# Patient Record
Sex: Female | Born: 1965 | Race: Black or African American | Hispanic: No | Marital: Married | Smoking: Never smoker
Health system: Southern US, Community
[De-identification: ages and names within clinical notes are randomized; demographics above are authoritative.]

---

## 2003-05-15 ENCOUNTER — Other Ambulatory Visit: Admission: RE | Admit: 2003-05-15 | Discharge: 2003-05-15 | Payer: Self-pay | Admitting: Obstetrics and Gynecology

## 2004-09-23 ENCOUNTER — Other Ambulatory Visit: Admission: RE | Admit: 2004-09-23 | Discharge: 2004-09-23 | Payer: Self-pay | Admitting: Obstetrics and Gynecology

## 2004-10-21 ENCOUNTER — Encounter: Admission: RE | Admit: 2004-10-21 | Discharge: 2004-10-21 | Payer: Self-pay | Admitting: Obstetrics and Gynecology

## 2008-11-08 ENCOUNTER — Encounter: Admission: RE | Admit: 2008-11-08 | Discharge: 2008-11-08 | Payer: Self-pay | Admitting: Obstetrics and Gynecology

## 2009-06-06 ENCOUNTER — Ambulatory Visit: Payer: Self-pay | Admitting: Vascular Surgery

## 2009-11-11 ENCOUNTER — Ambulatory Visit: Payer: Self-pay | Admitting: Vascular Surgery

## 2010-03-24 ENCOUNTER — Encounter: Admission: RE | Admit: 2010-03-24 | Discharge: 2010-03-24 | Payer: Self-pay | Admitting: Obstetrics and Gynecology

## 2011-02-16 DIAGNOSIS — M79609 Pain in unspecified limb: Secondary | ICD-10-CM

## 2011-04-08 ENCOUNTER — Other Ambulatory Visit: Payer: Self-pay | Admitting: Obstetrics and Gynecology

## 2011-04-08 DIAGNOSIS — Z1231 Encounter for screening mammogram for malignant neoplasm of breast: Secondary | ICD-10-CM

## 2011-04-17 ENCOUNTER — Ambulatory Visit
Admission: RE | Admit: 2011-04-17 | Discharge: 2011-04-17 | Disposition: A | Discharge status: Home / Self Care | Payer: 59 | Source: Ambulatory Visit | Attending: Obstetrics and Gynecology | Admitting: Obstetrics and Gynecology

## 2011-04-17 DIAGNOSIS — Z1231 Encounter for screening mammogram for malignant neoplasm of breast: Secondary | ICD-10-CM

## 2011-05-10 ENCOUNTER — Inpatient Hospital Stay (INDEPENDENT_AMBULATORY_CARE_PROVIDER_SITE_OTHER)
Admission: RE | Admit: 2011-05-10 | Discharge: 2011-05-10 | Disposition: A | Discharge status: Home / Self Care | Payer: 59 | Source: Ambulatory Visit | Attending: Emergency Medicine | Admitting: Emergency Medicine

## 2011-05-10 ENCOUNTER — Ambulatory Visit (INDEPENDENT_AMBULATORY_CARE_PROVIDER_SITE_OTHER): Payer: 59

## 2011-05-10 DIAGNOSIS — R071 Chest pain on breathing: Secondary | ICD-10-CM

## 2011-07-05 ENCOUNTER — Other Ambulatory Visit: Payer: Self-pay

## 2011-07-05 ENCOUNTER — Emergency Department (HOSPITAL_COMMUNITY)
Admission: EM | Admit: 2011-07-05 | Discharge: 2011-07-05 | Disposition: A | Discharge status: Nursing Home (Includes ICF) | Payer: 59 | Source: Home / Self Care | Attending: Emergency Medicine | Admitting: Emergency Medicine

## 2011-07-05 ENCOUNTER — Emergency Department (HOSPITAL_COMMUNITY): Payer: 59

## 2011-07-05 ENCOUNTER — Emergency Department (HOSPITAL_COMMUNITY)
Admission: EM | Admit: 2011-07-05 | Discharge: 2011-07-05 | Disposition: A | Discharge status: Home / Self Care | Payer: 59 | Attending: Emergency Medicine | Admitting: Emergency Medicine

## 2011-07-05 ENCOUNTER — Encounter (HOSPITAL_COMMUNITY): Payer: Self-pay | Admitting: Emergency Medicine

## 2011-07-05 ENCOUNTER — Emergency Department (INDEPENDENT_AMBULATORY_CARE_PROVIDER_SITE_OTHER): Payer: 59

## 2011-07-05 ENCOUNTER — Encounter (HOSPITAL_COMMUNITY): Payer: Self-pay | Admitting: *Deleted

## 2011-07-05 DIAGNOSIS — M546 Pain in thoracic spine: Secondary | ICD-10-CM | POA: Insufficient documentation

## 2011-07-05 DIAGNOSIS — R079 Chest pain, unspecified: Secondary | ICD-10-CM | POA: Insufficient documentation

## 2011-07-05 DIAGNOSIS — IMO0001 Reserved for inherently not codable concepts without codable children: Secondary | ICD-10-CM | POA: Insufficient documentation

## 2011-07-05 DIAGNOSIS — I2699 Other pulmonary embolism without acute cor pulmonale: Secondary | ICD-10-CM

## 2011-07-05 DIAGNOSIS — M7918 Myalgia, other site: Secondary | ICD-10-CM

## 2011-07-05 LAB — POCT I-STAT, CHEM 8
Calcium, Ion: 1.08 mmol/L — ABNORMAL LOW (ref 1.12–1.32)
Creatinine, Ser: 0.5 mg/dL (ref 0.50–1.10)
Glucose, Bld: 81 mg/dL (ref 70–99)
HCT: 29 % — ABNORMAL LOW (ref 36.0–46.0)
Hemoglobin: 9.9 g/dL — ABNORMAL LOW (ref 12.0–15.0)
TCO2: 22 mmol/L (ref 0–100)

## 2011-07-05 LAB — POCT I-STAT TROPONIN I

## 2011-07-05 MED ORDER — KETOROLAC TROMETHAMINE 60 MG/2ML IM SOLN
INTRAMUSCULAR | Status: AC
  Filled 2011-07-05: qty 2

## 2011-07-05 MED ORDER — IBUPROFEN 800 MG PO TABS: 800.0000 mg | ORAL_TABLET | Freq: Three times a day (TID) | ORAL | Status: AC

## 2011-07-05 MED ORDER — TRAMADOL HCL 50 MG PO TABS: 50.0000 mg | ORAL_TABLET | Freq: Four times a day (QID) | ORAL | Status: AC | PRN

## 2011-07-05 MED ORDER — KETOROLAC TROMETHAMINE 60 MG/2ML IM SOLN
60.0000 mg | Freq: Once | INTRAMUSCULAR | Status: AC
  Administered 2011-07-05: 60 mg via INTRAMUSCULAR

## 2011-07-05 MED ORDER — HYDROMORPHONE HCL PF 1 MG/ML IJ SOLN
1.0000 mg | Freq: Once | INTRAMUSCULAR | Status: AC
  Administered 2011-07-05: 1 mg via INTRAVENOUS
  Filled 2011-07-05: qty 1

## 2011-07-05 MED ORDER — ONDANSETRON HCL 4 MG/2ML IJ SOLN
4.0000 mg | Freq: Once | INTRAMUSCULAR | Status: AC
  Administered 2011-07-05: 4 mg via INTRAVENOUS
  Filled 2011-07-05: qty 2

## 2011-07-05 NOTE — ED Provider Notes (Signed)
Patient in CDU holding for CT angio chest.  Patient states pain has resolved.  I have discussed the results with her, have discussed pain control as well as intentional deep breaths and coughing throughout the day.  Pt verbalizes understanding.  States she does not want vicodin for pain as it makes her itch.    Rise Patience, [place] 07/05/11 [phone]

## 2011-07-05 NOTE — ED Notes (Signed)
Received pt from urgent care for further eval of right sided chest pain. Pt reports pain to right upper back worse than pain in right anterior chest area. Pt denies Shortness of breath, swelling or N/V.

## 2011-07-05 NOTE — ED Provider Notes (Signed)
History     CSN: [id number] Arrival date & time: 07/05/2011  2:16 PM   First MD Initiated Contact with Patient 07/05/11 1347      Chief Complaint  Patient presents with  . Back Pain    (Consider location/radiation/quality/duration/timing/severity/associated sxs/prior treatment) HPI Comments: She has a two-week history of intermittent pain in the right upper back radiating to the right lower back and around to the right submammary area. She denies any injury. It's been worse the past couple days it has been continuous and severe. The pain is worse with movement, deep inspiration, sneezing, or yawning and it radiates into her shoulder. She denies any fever, chills, coughing, wheezing, shortness of breath, or hemoptysis. She was here about 2 months ago with the same thing on the left side. She had x-rays and an EKG which were normal and was given ibuprofen and hydrocodone. The pain got better in about a week and she denies any leg pain or swelling.  Patient is a 45 y.o. female presenting with back pain.  Back Pain  Associated symptoms include chest pain. Pertinent negatives include no fever and no abdominal pain.    History reviewed. No pertinent past medical history.  History reviewed. No pertinent past surgical history.  History reviewed. No pertinent family history.  History  Substance Use Topics  . Smoking status: Not on file  . Smokeless tobacco: Not on file  . Alcohol Use: Not on file    OB History    Grav Para Term Preterm Abortions TAB SAB Ect Mult Living                  Review of Systems  Constitutional: Negative for fever, chills and diaphoresis.  Respiratory: Negative for cough, shortness of breath and wheezing.   Cardiovascular: Positive for chest pain. Negative for palpitations and leg swelling.  Gastrointestinal: Negative for nausea, vomiting and abdominal pain.  Musculoskeletal: Positive for back pain.    Allergies  Hydrocodone  Home Medications  No  current outpatient prescriptions on file.  BP 115/78  Pulse 67  Temp(Src) 98.8 F (37.1 C) (Oral)  Resp 19  SpO2 100%  LMP 06/24/2011  Physical Exam  Nursing note and vitals reviewed. Constitutional: She appears well-developed and well-nourished. She appears distressed.       She appears to be moderately uncomfortable.  Cardiovascular: Normal rate, regular rhythm, S1 normal, S2 normal, intact distal pulses and normal pulses.   No extrasystoles are present. PMI is not displaced.  Exam reveals no gallop, no S3, no S4, no distant heart sounds and no friction rub.   No murmur heard. Pulmonary/Chest: Effort normal and breath sounds normal. No respiratory distress. She has no wheezes. She has no rales. She exhibits no tenderness.       There was pain to palpation in the right upper back area.  Abdominal: Soft. Bowel sounds are normal. She exhibits no distension and no mass. There is no tenderness. There is no rebound and no guarding.  Skin: Skin is warm and dry. No rash noted. She is not diaphoretic.    ED Course  Procedures (including critical care time)  Labs Reviewed  D-DIMER, QUANTITATIVE - Abnormal; Notable for the following:    D-Dimer, Quant 0.68 (*)    All other components within normal limits   Dg Chest 2 View  07/05/2011  *RADIOLOGY REPORT*  Clinical Data: Right chest/upper back pain  CHEST - 2 VIEW  Comparison: 05/09/2010  Findings: Lungs are clear. No pleural effusion  or pneumothorax.  The heart is normal in size.  Visualized osseous structures are within normal limits.  IMPRESSION: No evidence of acute cardiopulmonary disease.  Original Report Authenticated By: Charline Bills, M.D.   Results for orders placed during the hospital encounter of 07/05/11  D-DIMER, QUANTITATIVE      Component Value Range   D-Dimer, Quant 0.68 (*) 0.00 - 0.48 (ug/mL-FEU)    Date: 07/05/2011  Rate: 69  Rhythm: normal sinus rhythm  QRS Axis: normal  Intervals: normal  ST/T Wave  abnormalities: normal  Conduction Disutrbances:none  Narrative Interpretation: Normal EKG  Old EKG Reviewed: none available Her symptoms are suspicious for pulmonary embolus with pleuritic right hemithorax pain.  The patient was given the following meds in the [facility]:   Medications  ketorolac (TORADOL) injection 60 mg (60 mg Intramuscular Given 07/05/11 1605)     And had the following response:  No relief of pain.   1. Pulmonary embolus       MDM  Her symptoms are suspicious for pulmonary embolus with pleuritic right hemithorax pain.        Roque Lias, MD 07/05/11 [phone]

## 2011-07-05 NOTE — ED Notes (Signed)
Co pain starting 2 days ago under right arm area, going into back/spinal area, states pain is mainly in mid back, spine area, denies injury to back, no precipitating factors

## 2011-07-05 NOTE — ED Provider Notes (Signed)
History     CSN: [id number] Arrival date & time: 07/05/2011  5:21 PM   First MD Initiated Contact with Patient 07/05/11 1732      Chief Complaint  Patient presents with  . Chest Pain    Urgent care transfer    (Consider location/radiation/quality/duration/timing/severity/associated sxs/prior treatment) Patient is a 45 y.o. female presenting with chest pain. The history is provided by the patient.  Chest Pain The chest pain began 1 - 2 weeks ago (also reports R upper back pain). Duration of episode(s) is 2 weeks. Chest pain occurs intermittently. The chest pain is unchanged. The pain is associated with breathing and coughing. The severity of the pain is moderate. The quality of the pain is described as aching and sharp. The pain does not radiate. Chest pain is worsened by certain positions. Pertinent negatives for primary symptoms include no fever, no cough, no palpitations, no nausea, no vomiting and no dizziness. She tried nothing for the symptoms.     History reviewed. No pertinent past medical history.  Past Surgical History  Procedure Date  . Cesarean section     History reviewed. No pertinent family history.  History  Substance Use Topics  . Smoking status: Never Smoker   . Smokeless tobacco: Never Used  . Alcohol Use: Yes    OB History    Grav Para Term Preterm Abortions TAB SAB Ect Mult Living                  Review of Systems  Constitutional: Negative for fever.  Respiratory: Negative for cough.   Cardiovascular: Positive for chest pain. Negative for palpitations.  Gastrointestinal: Negative for nausea and vomiting.  Neurological: Negative for dizziness.  All other systems reviewed and are negative.    Allergies  Hydrocodone  Home Medications  No current outpatient prescriptions on file.  LMP 06/24/2011  Physical Exam  Nursing note and vitals reviewed. Constitutional: She is oriented to person, place, and time. She appears well-developed and  well-nourished. No distress.  HENT:  Head: Normocephalic and atraumatic.  Eyes: Conjunctivae are normal. Pupils are equal, round, and reactive to light.  Neck: Normal range of motion. Neck supple.  Cardiovascular: Normal rate and regular rhythm.   Pulmonary/Chest: Effort normal and breath sounds normal.  Abdominal: Soft. There is no tenderness. There is no rebound.  Musculoskeletal: Normal range of motion. She exhibits no edema and no tenderness.       TTP of R scapula area, no bruising or injury noted  Neurological: She is alert and oriented to person, place, and time. No cranial nerve deficit.  Skin: Skin is warm and dry.    ED Course  Procedures (including critical care time)   Labs Reviewed  I-STAT, CHEM 8   Dg Chest 2 View  07/05/2011  *RADIOLOGY REPORT*  Clinical Data: Right chest/upper back pain  CHEST - 2 VIEW  Comparison: 05/09/2010  Findings: Lungs are clear. No pleural effusion or pneumothorax.  The heart is normal in size.  Visualized osseous structures are within normal limits.  IMPRESSION: No evidence of acute cardiopulmonary disease.  Original Report Authenticated By: Charline Bills, M.D.     No diagnosis found.    MDM  Pt presented due to chest pain and back pain intermittent for 2 weeks worsened over night.  CXR at urgent care neg.  D dimer positive.  CTA chest ordered to r/o PE.   Care transferred to [facility] in CDU       Geraldine Solar  Waldo Laine, MD 07/05/11 [phone]

## 2011-07-05 NOTE — ED Notes (Signed)
Pt waiting to go for ct angio of chest to r/o pe.

## 2011-07-05 NOTE — ED Notes (Signed)
Pt states understanding of discharge instructions and denies questions at time. Of discharge. Pt AOx4, NAD and ambulatory with steady gait at time of discharge.

## 2011-07-06 NOTE — ED Provider Notes (Signed)
Medical screening examination/treatment/procedure(s) were performed by non-physician practitioner and as supervising physician I was immediately available for consultation/collaboration.  Theodore Rahrig, MD 07/06/11 0855 

## 2011-07-06 NOTE — ED Provider Notes (Signed)
I saw and evaluated the patient, reviewed the resident's note and I agree with the findings and plan.  Pulm: CTAB, no c/w/r    Cyndra Numbers, MD 07/06/11 0004

## 2012-08-05 ENCOUNTER — Other Ambulatory Visit: Payer: Self-pay | Admitting: Obstetrics and Gynecology

## 2012-08-05 DIAGNOSIS — Z1231 Encounter for screening mammogram for malignant neoplasm of breast: Secondary | ICD-10-CM

## 2012-08-24 ENCOUNTER — Ambulatory Visit
Admission: RE | Admit: 2012-08-24 | Discharge: 2012-08-24 | Disposition: A | Discharge status: Home / Self Care | Payer: Private Health Insurance - Indemnity | Source: Ambulatory Visit | Attending: Obstetrics and Gynecology | Admitting: Obstetrics and Gynecology

## 2012-08-24 DIAGNOSIS — Z1231 Encounter for screening mammogram for malignant neoplasm of breast: Secondary | ICD-10-CM

## 2012-08-29 ENCOUNTER — Other Ambulatory Visit: Payer: Self-pay | Admitting: Obstetrics and Gynecology

## 2012-08-29 DIAGNOSIS — R928 Other abnormal and inconclusive findings on diagnostic imaging of breast: Secondary | ICD-10-CM

## 2012-09-08 ENCOUNTER — Ambulatory Visit
Admission: RE | Admit: 2012-09-08 | Discharge: 2012-09-08 | Disposition: A | Discharge status: Home / Self Care | Payer: Private Health Insurance - Indemnity | Source: Ambulatory Visit | Attending: Obstetrics and Gynecology | Admitting: Obstetrics and Gynecology

## 2012-09-08 DIAGNOSIS — R928 Other abnormal and inconclusive findings on diagnostic imaging of breast: Secondary | ICD-10-CM

## 2012-11-03 ENCOUNTER — Encounter (HOSPITAL_COMMUNITY): Payer: Self-pay | Admitting: Emergency Medicine

## 2012-11-03 ENCOUNTER — Emergency Department (HOSPITAL_COMMUNITY)
Admission: EM | Admit: 2012-11-03 | Discharge: 2012-11-03 | Disposition: A | Discharge status: Home / Self Care | Payer: Private Health Insurance - Indemnity | Source: Home / Self Care

## 2012-11-03 DIAGNOSIS — L0390 Cellulitis, unspecified: Secondary | ICD-10-CM

## 2012-11-03 DIAGNOSIS — L0291 Cutaneous abscess, unspecified: Secondary | ICD-10-CM

## 2012-11-03 MED ORDER — SULFAMETHOXAZOLE-TRIMETHOPRIM 800-160 MG PO TABS: 1.0000 | ORAL_TABLET | Freq: Two times a day (BID) | ORAL | Status: DC

## 2012-11-03 NOTE — ED Notes (Signed)
Pt is c/o abscess of the upper left inner thigh near pany line. Present since 3/18. Area is red and swollen, gradually getting bigger.  Pt has used a warm compress with no improvement.

## 2012-11-03 NOTE — ED Provider Notes (Signed)
History     CSN: [id number]  Arrival date & time 11/03/12  1804   None     Chief Complaint  Patient presents with  . Abscess    inner left upper thigh pany line area.     (Consider location/radiation/quality/duration/timing/severity/associated sxs/prior treatment) HPI Comments: Developed a firm, tender nodule to the L proximal inner thigh 2 d ago. Yesterday very painful. Today less painful and smaller. No constitutional symptoms.   Patient is a 47 y.o. female presenting with abscess.  Abscess   History reviewed. No pertinent past medical history.  Past Surgical History  Procedure Laterality Date  . Cesarean section      History reviewed. No pertinent family history.  History  Substance Use Topics  . Smoking status: Never Smoker   . Smokeless tobacco: Never Used  . Alcohol Use: Yes    OB History   Grav Para Term Preterm Abortions TAB SAB Ect Mult Living                  Review of Systems  Constitutional: Negative.   Respiratory: Negative.   Gastrointestinal: Negative.   Genitourinary: Negative.   Skin:       As per HPI  Neurological: Negative.     Allergies  Hydrocodone  Home Medications   Current Outpatient Rx  Name  Route  Sig  Dispense  Refill  . sulfamethoxazole-trimethoprim (SEPTRA DS) 800-160 MG per tablet   Oral   Take 1 tablet by mouth 2 (two) times daily. X 7 days   14 tablet   0     BP 110/78  Pulse 91  Temp(Src) 97.8 F (36.6 C) (Oral)  Resp 12  SpO2 98%  LMP 10/26/2012  Physical Exam  Nursing note and vitals reviewed. Constitutional: She is oriented to person, place, and time. She appears well-developed and well-nourished. No distress.  Neck: Normal range of motion. Neck supple.  Pulmonary/Chest: Effort normal.  Musculoskeletal: She exhibits no edema and no tenderness.  Neurological: She is alert and oriented to person, place, and time. She exhibits normal muscle tone.  Skin: Skin is warm and dry.  2 cm soft and mildly  tender raised nodule over the skin located in  The left inguinal fold along the medial , proximal thigh. No induration, drainage, lymphangitis.    ED Course  Procedures (including critical care time)  Labs Reviewed - No data to display No results found.   1. Abscess       MDM  Keep are clean with rubbing alcohol. Septra ds bid for 7 days.  Wash hands frequently. If getting worse, larger or painful may return.         Hayden Rasmussen, NP 11/03/12 1914

## 2012-11-08 NOTE — ED Provider Notes (Signed)
Medical screening examination/treatment/procedure(s) were performed by resident physician or non-physician practitioner and as supervising physician I was immediately available for consultation/collaboration.   Dahlia Nifong DOUGLAS MD.   Dantrell Schertzer D Tharon Bomar, MD 11/08/12 1005 

## 2012-11-22 IMAGING — CR DG CHEST 2V
2 series · 2 of 2 positions shown · non-contrast
Comparison: None.

CLINICAL DATA: Left-sided chest pain.

CHEST - 2 VIEW 05/10/2011:

[view not recorded (1 of 2)]
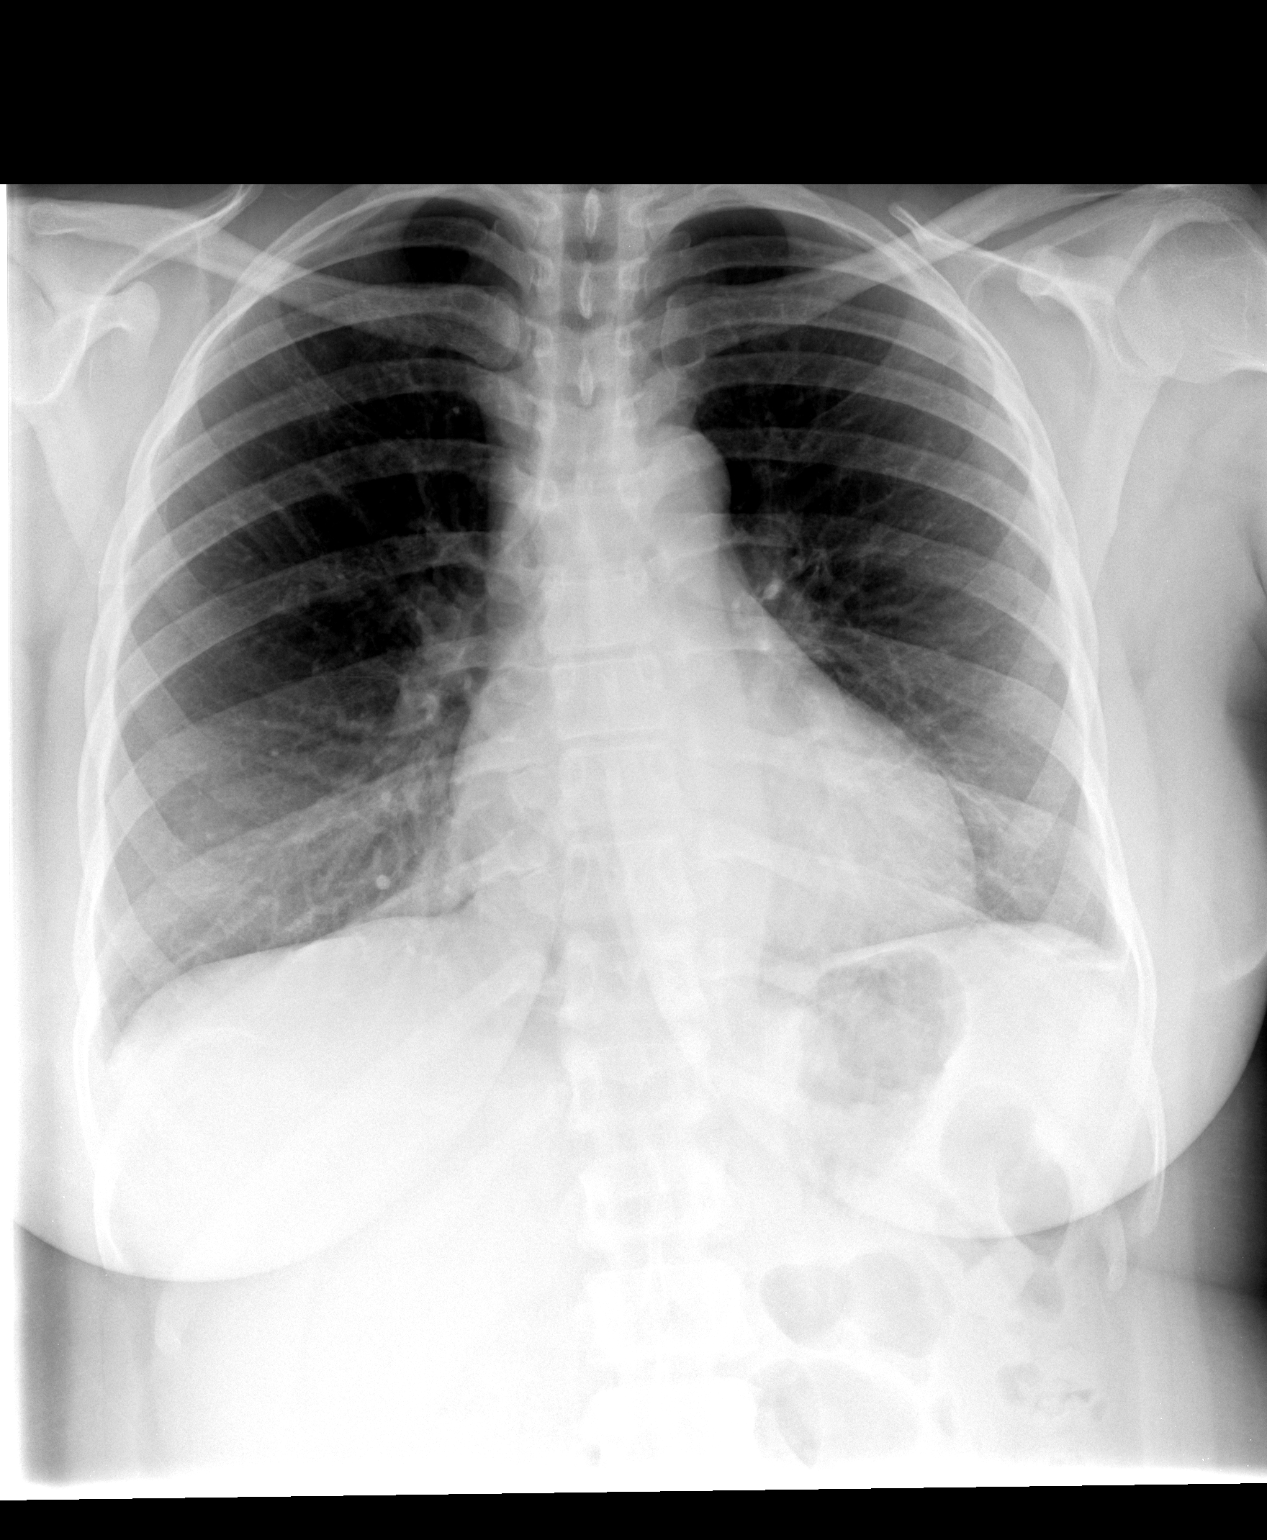

[view not recorded (2 of 2)]
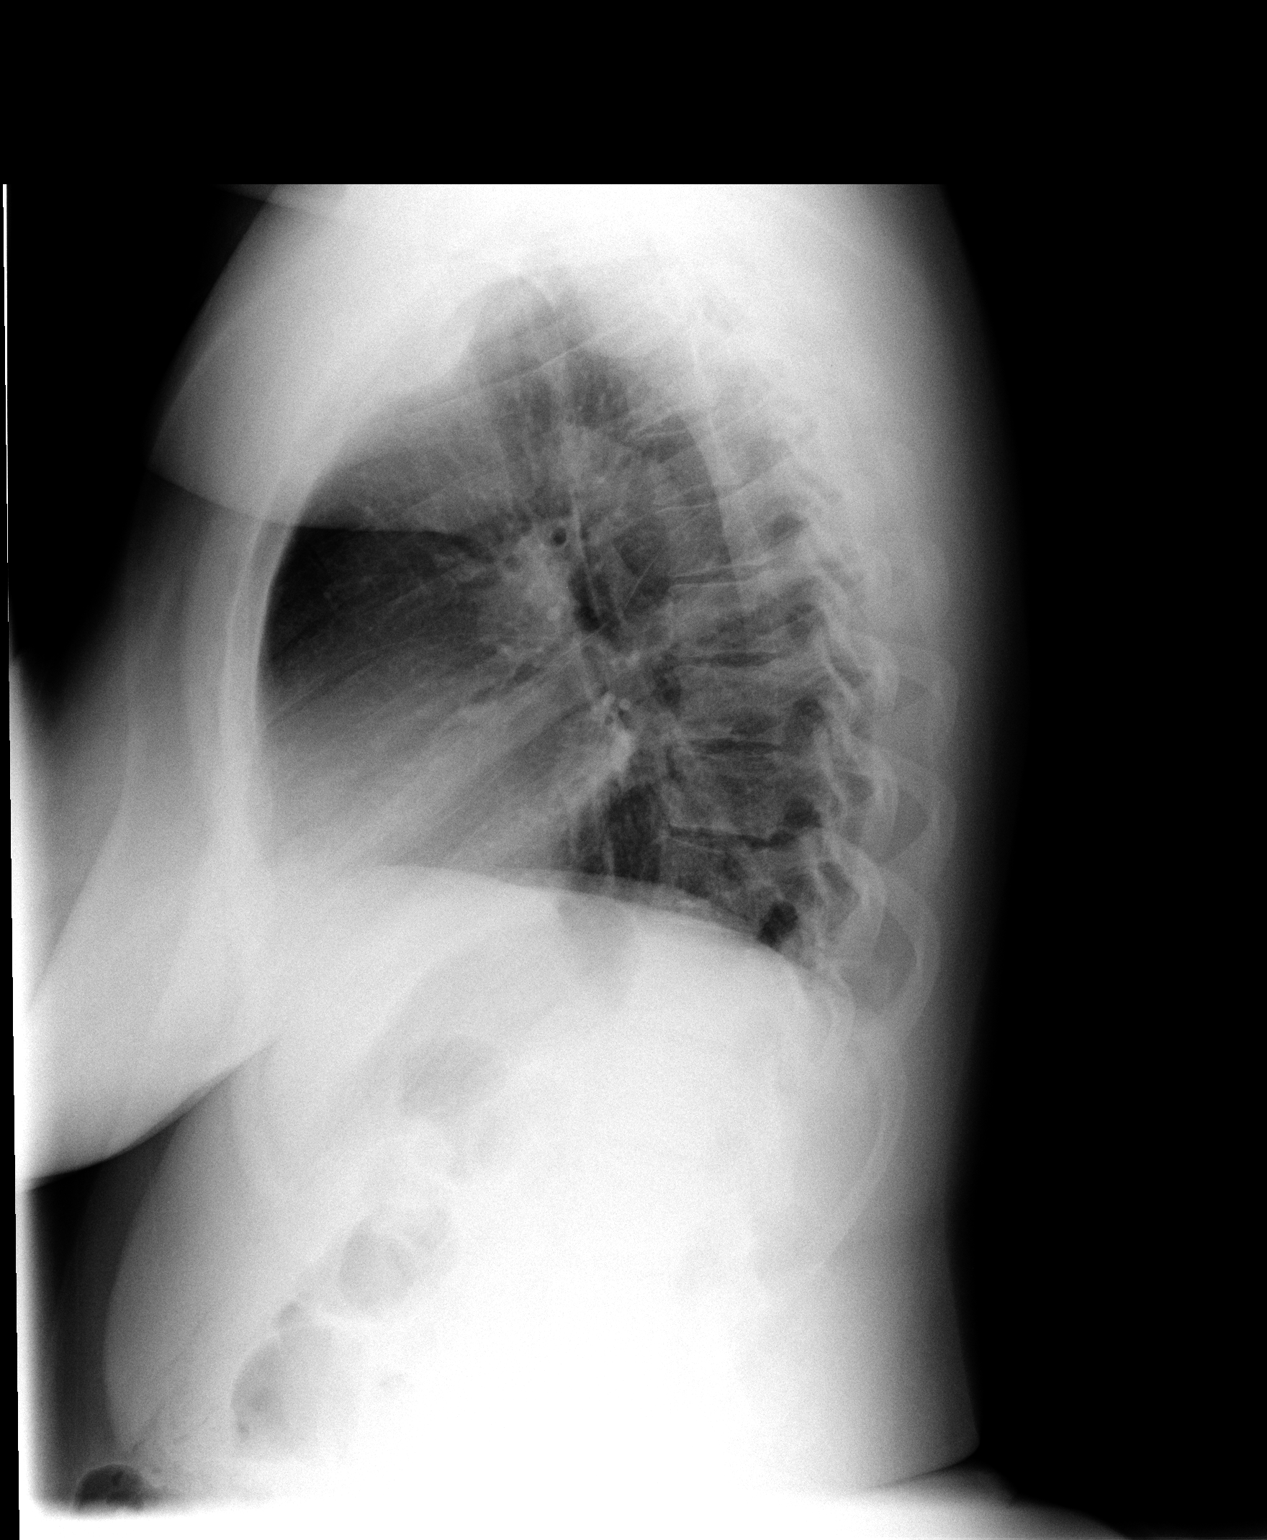

[2 of 2 positions shown; findings below may reference images not displayed]

FINDINGS: Cardiac silhouette upper normal in size.  Hilar and
mediastinal contours unremarkable.  Lungs clear apart from minimal
linear scarring deep in the lower lobes.  No pleural effusions.
Visualized bony thorax intact.
IMPRESSION: No acute cardiopulmonary disease.  Mild linear scarring deep in the
lower lobes.  Borderline heart size.

## 2013-01-17 IMAGING — CT CT ANGIO CHEST
2 of 6 series · 19 of 46 positions shown · IV contrast (APPLIED)
Comparison: 07/05/2011 chest radiograph

CLINICAL DATA: Shortness of breath.  Chest pain, radiating to the
back, and worsening.

CT ANGIOGRAPHY CHEST WITH CONTRAST
TECHNIQUE: Multidetector CT imaging of the chest was performed
using the standard protocol during bolus administration of
intravenous contrast.  Multiplanar CT image reconstructions
including MIPs were obtained to evaluate the vascular anatomy.
Contrast:  100 ml Fmnipaque-MMM

[Series 8: pulm embolism 1.0 b25f thin · axial · 0.65mm/px · z∈[-144,+84]mm · 16 of 250 slices shown]
[im 11/250  lung]
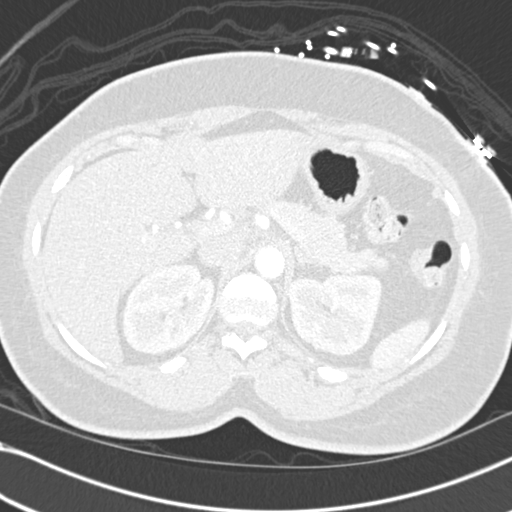
[im 33/250  soft-tissue]
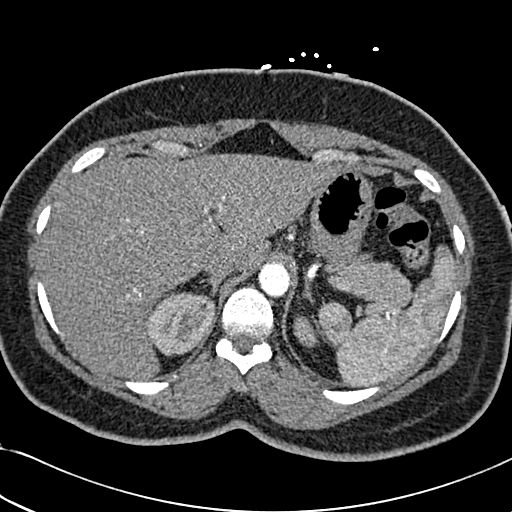
[im 44/250  lung]
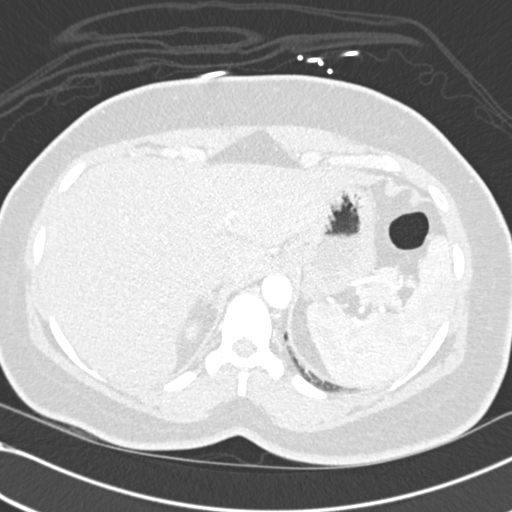
[im 55/250  soft-tissue]
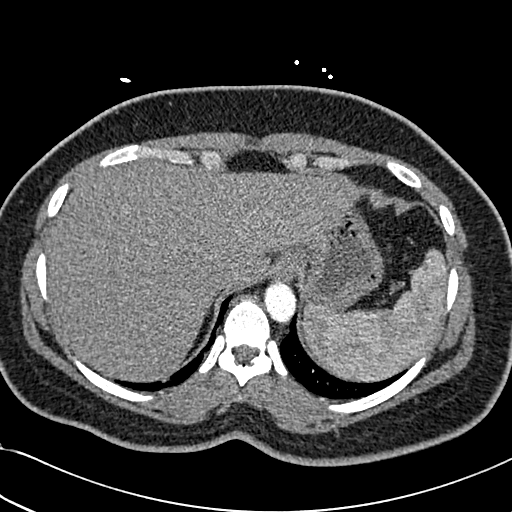
[im 76/250  lung]
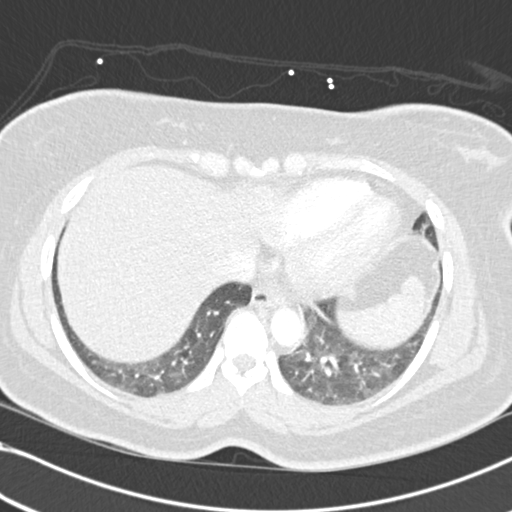
[im 87/250  soft-tissue]
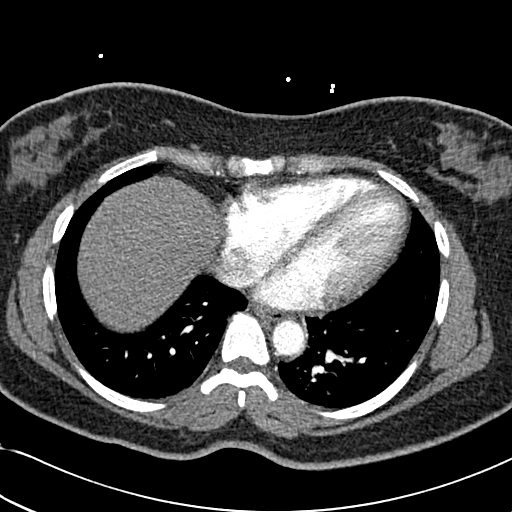
[im 98/250  lung]
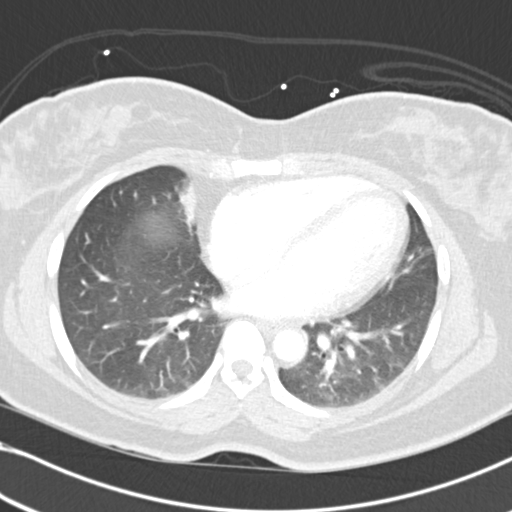
[im 120/250  soft-tissue]
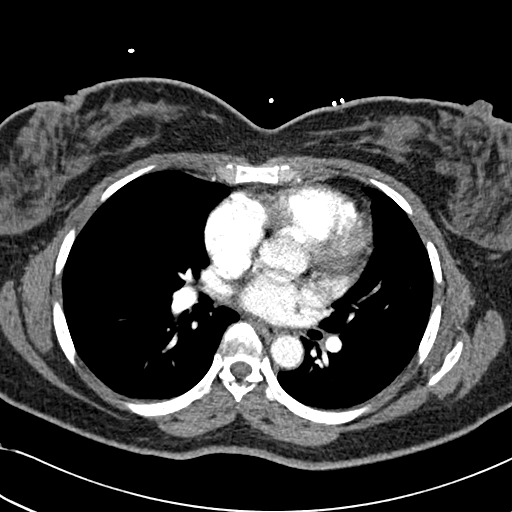
[im 130/250  lung]
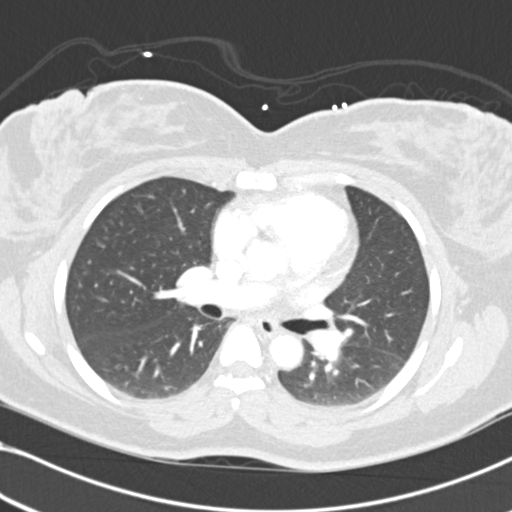
[im 152/250  soft-tissue]
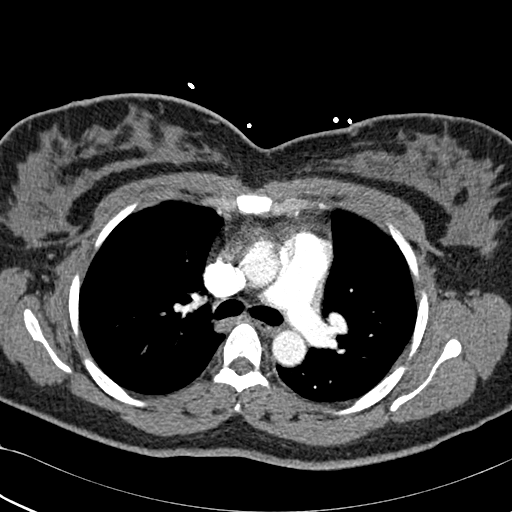
[im 163/250  lung]
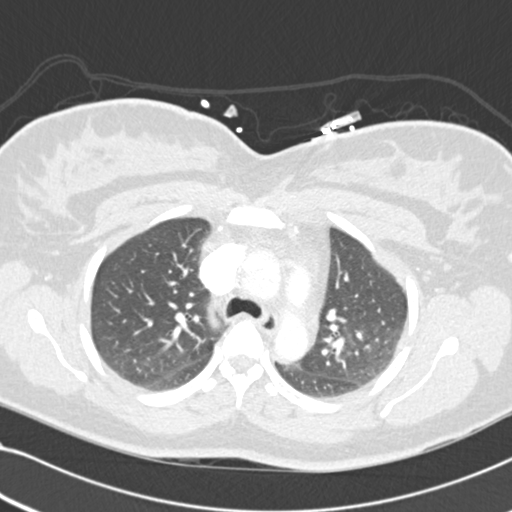
[im 174/250  soft-tissue]
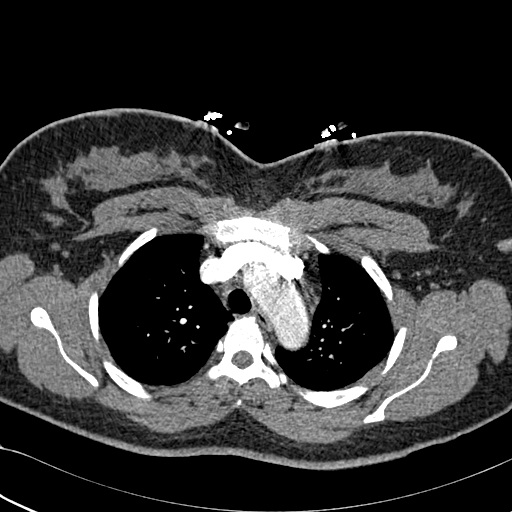
[im 195/250  lung]
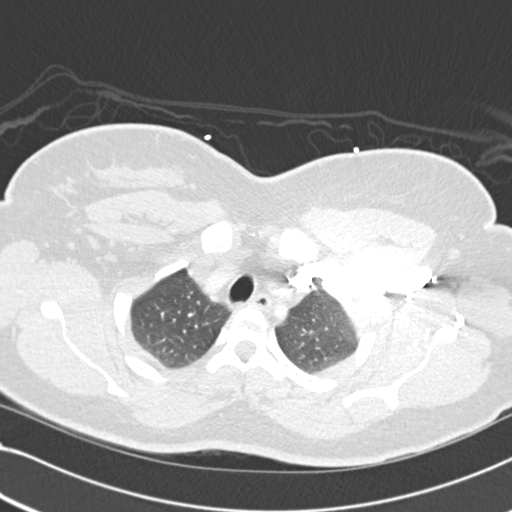
[im 206/250  soft-tissue]
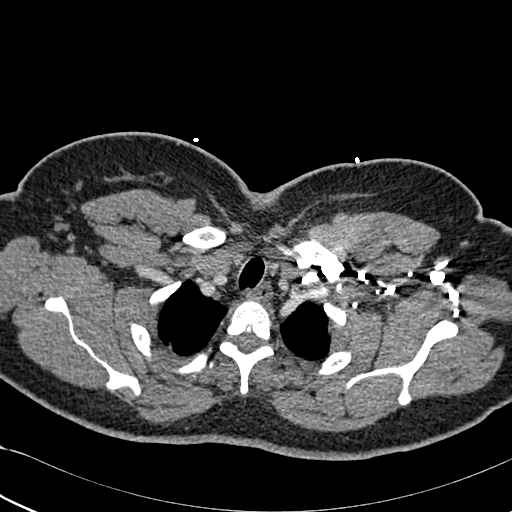
[im 217/250  lung]
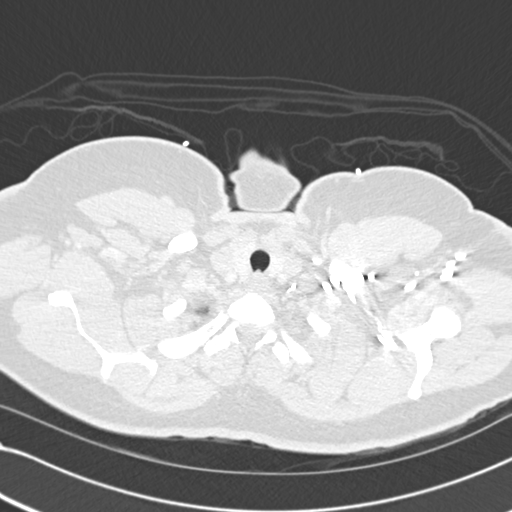
[im 239/250  soft-tissue]
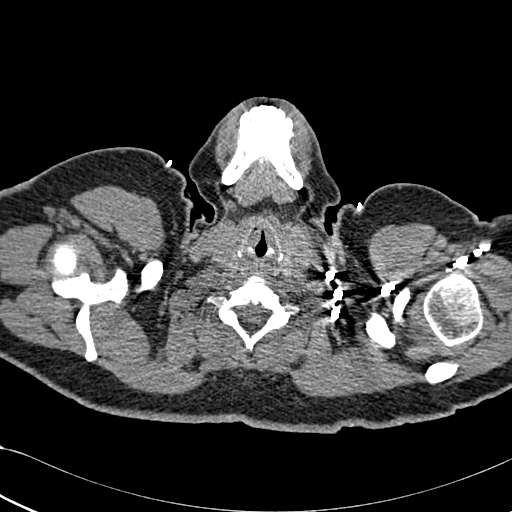

[Series 602: cor · coronal · 0.65mm/px · 3 of 98 slices shown]
[im 25/98  soft-tissue]
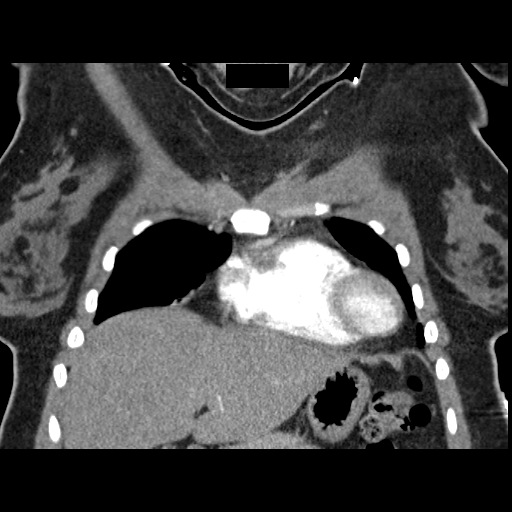
[im 49/98  soft-tissue]
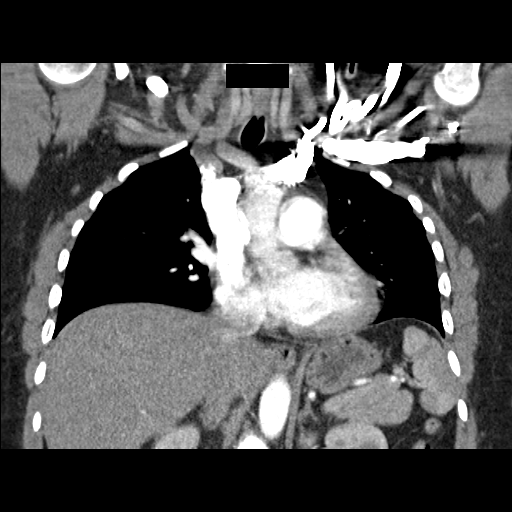
[im 73/98  soft-tissue]
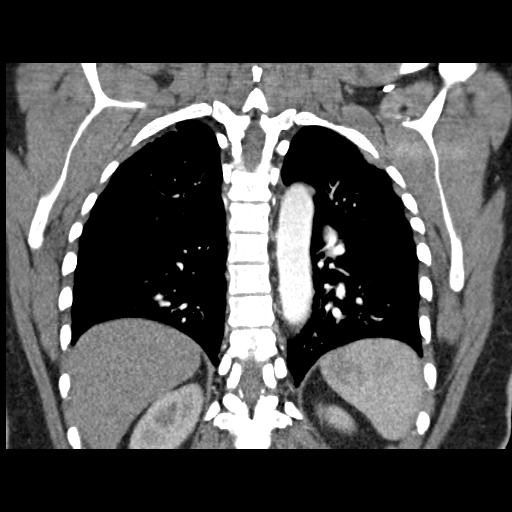

[19 of 46 positions shown; findings below may reference images not displayed]

FINDINGS: No filling defect is identified in the pulmonary
arterial tree to suggest pulmonary embolus.

No acute aortic abnormality is observed.  No pericardial effusion
is noted.  There is borderline cardiomegaly.

No pathologic thoracic adenopathy is observed.  The lungs appear
clear.

Review of the MIP images confirms the above findings.
IMPRESSION: 1. No filling defect is identified in the pulmonary arterial tree
to suggest pulmonary embolus.  No specific abnormality is
identified to explain the patient's chest pain and shortness
breath.

## 2013-05-05 ENCOUNTER — Encounter (HOSPITAL_COMMUNITY): Payer: Self-pay | Admitting: Emergency Medicine

## 2013-05-05 ENCOUNTER — Emergency Department (HOSPITAL_COMMUNITY)
Admission: EM | Admit: 2013-05-05 | Discharge: 2013-05-05 | Disposition: A | Discharge status: Home / Self Care | Payer: Private Health Insurance - Indemnity | Source: Home / Self Care

## 2013-05-05 DIAGNOSIS — M7551 Bursitis of right shoulder: Secondary | ICD-10-CM

## 2013-05-05 DIAGNOSIS — M722 Plantar fascial fibromatosis: Secondary | ICD-10-CM

## 2013-05-05 DIAGNOSIS — M67919 Unspecified disorder of synovium and tendon, unspecified shoulder: Secondary | ICD-10-CM

## 2013-05-05 MED ORDER — DICLOFENAC POTASSIUM 50 MG PO TABS: 50.0000 mg | ORAL_TABLET | Freq: Three times a day (TID) | ORAL | Status: DC

## 2013-05-05 NOTE — ED Provider Notes (Signed)
CSN: [id number]     Arrival date & time 05/05/13  1340 History   None    Chief Complaint  Patient presents with  . Shoulder Pain  . Foot Pain   (Consider location/radiation/quality/duration/timing/severity/associated sxs/prior Treatment) Patient is a 47 y.o. female presenting with shoulder pain and lower extremity pain. The history is provided by the patient and the spouse.  Shoulder Pain This is a new problem. The current episode started more than 1 week ago (3 wk h/o pain after using elliptical to exercise.). The problem has not changed since onset.Pertinent negatives include no chest pain and no abdominal pain.  Foot Pain This is a new problem. The current episode started more than 2 days ago (onset with walking a lot in sandals at beach last week.). The problem has not changed since onset.Pertinent negatives include no chest pain and no abdominal pain.    History reviewed. No pertinent past medical history. Past Surgical History  Procedure Laterality Date  . Cesarean section     History reviewed. No pertinent family history. History  Substance Use Topics  . Smoking status: Never Smoker   . Smokeless tobacco: Never Used  . Alcohol Use: Yes   OB History   Grav Para Term Preterm Abortions TAB SAB Ect Mult Living                 Review of Systems  Cardiovascular: Negative for chest pain.  Gastrointestinal: Negative for abdominal pain.    Allergies  Hydrocodone  Home Medications   Current Outpatient Rx  Name  Route  Sig  Dispense  Refill  . Lorcaserin HCl (BELVIQ) 10 MG TABS   Oral   Take by mouth.         . diclofenac (CATAFLAM) 50 MG tablet   Oral   Take 1 tablet (50 mg total) by mouth 3 (three) times daily.   30 tablet   0   . sulfamethoxazole-trimethoprim (SEPTRA DS) 800-160 MG per tablet   Oral   Take 1 tablet by mouth 2 (two) times daily. X 7 days   14 tablet   0    BP 120/79  Pulse 66  Temp(Src) 97.6 F (36.4 C) (Oral)  Resp 16  SpO2 100%   LMP 04/04/2013 Physical Exam  Nursing note and vitals reviewed. Constitutional: She is oriented to person, place, and time. She appears well-developed and well-nourished.  Musculoskeletal: She exhibits tenderness.       Right shoulder: She exhibits tenderness and pain. She exhibits normal range of motion, no bony tenderness, no swelling, no effusion, normal pulse and normal strength.       Feet:  Neurological: She is alert and oriented to person, place, and time.  Skin: Skin is warm and dry.    ED Course  Procedures (including critical care time) Labs Review Labs Reviewed - No data to display Imaging Review No results found.  MDM      Linna Hoff, MD 05/05/13 [phone]

## 2013-05-05 NOTE — ED Notes (Signed)
C/o right shoulder pain and right foot pain.  Shoulder pain for two weeks.  Denies injury but has been exercising recently.  Patient states arm hurts to pick up items.  Patient has took pain medication daily.   Right foot pain is on the bottom of foot which started Wednesday on her arch.  Denies injury.

## 2013-05-29 ENCOUNTER — Ambulatory Visit: Payer: Private Health Insurance - Indemnity | Attending: Sports Medicine | Admitting: Physical Therapy

## 2013-05-29 DIAGNOSIS — M6281 Muscle weakness (generalized): Secondary | ICD-10-CM | POA: Insufficient documentation

## 2013-05-29 DIAGNOSIS — R293 Abnormal posture: Secondary | ICD-10-CM | POA: Insufficient documentation

## 2013-05-29 DIAGNOSIS — M25519 Pain in unspecified shoulder: Secondary | ICD-10-CM | POA: Insufficient documentation

## 2013-05-29 DIAGNOSIS — IMO0001 Reserved for inherently not codable concepts without codable children: Secondary | ICD-10-CM | POA: Insufficient documentation

## 2013-06-05 ENCOUNTER — Ambulatory Visit: Payer: Private Health Insurance - Indemnity | Admitting: Physical Therapy

## 2013-06-08 ENCOUNTER — Ambulatory Visit: Payer: Private Health Insurance - Indemnity | Admitting: Physical Therapy

## 2013-06-12 ENCOUNTER — Ambulatory Visit: Payer: Private Health Insurance - Indemnity | Admitting: Physical Therapy

## 2013-06-19 ENCOUNTER — Ambulatory Visit: Payer: Private Health Insurance - Indemnity | Attending: Sports Medicine | Admitting: Physical Therapy

## 2013-06-19 DIAGNOSIS — IMO0001 Reserved for inherently not codable concepts without codable children: Secondary | ICD-10-CM | POA: Insufficient documentation

## 2013-06-19 DIAGNOSIS — M6281 Muscle weakness (generalized): Secondary | ICD-10-CM | POA: Insufficient documentation

## 2013-06-19 DIAGNOSIS — R293 Abnormal posture: Secondary | ICD-10-CM | POA: Insufficient documentation

## 2013-06-19 DIAGNOSIS — M25519 Pain in unspecified shoulder: Secondary | ICD-10-CM | POA: Insufficient documentation

## 2013-11-16 ENCOUNTER — Other Ambulatory Visit: Payer: Self-pay

## 2013-11-16 DIAGNOSIS — Z1231 Encounter for screening mammogram for malignant neoplasm of breast: Secondary | ICD-10-CM

## 2013-11-23 ENCOUNTER — Ambulatory Visit
Admission: RE | Admit: 2013-11-23 | Discharge: 2013-11-23 | Disposition: A | Discharge status: Home / Self Care | Payer: Private Health Insurance - Indemnity | Source: Ambulatory Visit

## 2013-11-23 DIAGNOSIS — Z1231 Encounter for screening mammogram for malignant neoplasm of breast: Secondary | ICD-10-CM

## 2015-12-21 ENCOUNTER — Ambulatory Visit (HOSPITAL_COMMUNITY)
Admission: EM | Admit: 2015-12-21 | Discharge: 2015-12-21 | Disposition: A | Discharge status: Home / Self Care | Payer: BC Managed Care – PPO | Attending: Family Medicine | Admitting: Family Medicine

## 2015-12-21 ENCOUNTER — Encounter (HOSPITAL_COMMUNITY): Payer: Self-pay | Admitting: Emergency Medicine

## 2015-12-21 DIAGNOSIS — T7840XA Allergy, unspecified, initial encounter: Secondary | ICD-10-CM | POA: Diagnosis not present

## 2015-12-21 DIAGNOSIS — R220 Localized swelling, mass and lump, head: Secondary | ICD-10-CM | POA: Diagnosis not present

## 2015-12-21 DIAGNOSIS — R21 Rash and other nonspecific skin eruption: Secondary | ICD-10-CM

## 2015-12-21 MED ORDER — METHYLPREDNISOLONE ACETATE 80 MG/ML IJ SUSP
INTRAMUSCULAR | Status: AC
  Filled 2015-12-21: qty 1

## 2015-12-21 MED ORDER — DIPHENHYDRAMINE HCL 25 MG PO CAPS
ORAL_CAPSULE | ORAL | Status: AC
  Filled 2015-12-21: qty 2

## 2015-12-21 MED ORDER — DIPHENHYDRAMINE HCL 25 MG PO CAPS
50.0000 mg | ORAL_CAPSULE | Freq: Once | ORAL | Status: AC
  Administered 2015-12-21: 50 mg via ORAL

## 2015-12-21 MED ORDER — METHYLPREDNISOLONE ACETATE 80 MG/ML IJ SUSP
80.0000 mg | Freq: Once | INTRAMUSCULAR | Status: AC
  Administered 2015-12-21: 80 mg via INTRAMUSCULAR

## 2015-12-21 NOTE — ED Notes (Signed)
Pt c/o poss allergic reaction to something she ate around 1845 Reports she ate fish, fries and a salad and since, she's been having itching all over, facial swelling w/some chest discomfort A&O x4... No acute distress.

## 2015-12-21 NOTE — ED Provider Notes (Signed)
CSN: [id number]     Arrival date & time 12/21/15  1917 History   First MD Initiated Contact with Patient 12/21/15 1946     Chief Complaint  Patient presents with  . Allergic Reaction   (Consider location/radiation/quality/duration/timing/severity/associated sxs/prior Treatment) HPI Comments: Michelle Barry presents following an allergic reaction around 1845 today. No new food except fish from a market. She noted immediate swelling in her face, mild chest discomfort and rash. She immediately presented here. No prior history of allergic reaction. Currently she is feeling some better though she is still feeling "itchy" and has swelling.   Patient is a 50 y.o. female presenting with allergic reaction. The history is provided by the patient.  Allergic Reaction Presenting symptoms: rash   Presenting symptoms: no wheezing     History reviewed. No pertinent past medical history. Past Surgical History  Procedure Laterality Date  . Cesarean section     No family history on file. Social History  Substance Use Topics  . Smoking status: Never Smoker   . Smokeless tobacco: Never Used  . Alcohol Use: Yes   OB History    No data available     Review of Systems  HENT: Negative.   Respiratory: Positive for chest tightness. Negative for shortness of breath and wheezing.   Skin: Positive for rash.  Allergic/Immunologic: Positive for food allergies.  Psychiatric/Behavioral: Negative.     Allergies  Hydrocodone  Home Medications   Prior to Admission medications   Medication Sig Start Date End Date Taking? Authorizing Provider  diclofenac (CATAFLAM) 50 MG tablet Take 1 tablet (50 mg total) by mouth 3 (three) times daily. 05/05/13   Linna HoffJames D Kindl, MD  Lorcaserin HCl (BELVIQ) 10 MG TABS Take by mouth.    Historical Provider, MD  sulfamethoxazole-trimethoprim (SEPTRA DS) 800-160 MG per tablet Take 1 tablet by mouth 2 (two) times daily. X 7 days 11/03/12   Hayden Rasmussenavid Mabe, NP   Meds Ordered and  Administered this Visit   Medications  diphenhydrAMINE (BENADRYL) capsule 50 mg (50 mg Oral Given 12/21/15 2017)  methylPREDNISolone acetate (DEPO-MEDROL) injection 80 mg (80 mg Intramuscular Given 12/21/15 2017)    LMP 12/07/2015 No data found.   Physical Exam  Constitutional: She is oriented to person, place, and time. She appears well-developed and well-nourished. No distress.  HENT:  No oropharynx swelling, swelling peri-orbital region  Neck: Normal range of motion. No tracheal deviation present.  Pulmonary/Chest: Effort normal and breath sounds normal. She has no wheezes.  Lymphadenopathy:    She has no cervical adenopathy.  Neurological: She is alert and oriented to person, place, and time.  Skin: Skin is warm and dry. Rash noted. She is not diaphoretic.  Mild lacy rash to chest, urticaria  Psychiatric: Her behavior is normal.  Nursing note and vitals reviewed.   ED Course  Procedures (including critical care time)  Labs Review Labs Reviewed - No data to display  Imaging Review No results found.   Visual Acuity Review  Right Eye Distance:   Left Eye Distance:   Bilateral Distance:    Right Eye Near:   Left Eye Near:    Bilateral Near:         MDM   1. Allergic reaction, initial encounter   2. Rash and nonspecific skin eruption   3. Facial swelling    Stable. Covered with Benadryl 50mg  here and DepoMedrol 80mg  IM, did not feel EPI was needed at this point. She was instructed on reasons to return should  she develop a delayed reaction, though thought this was rare.     Riki Sheer, PA-C 12/21/15 2102

## 2015-12-21 NOTE — Discharge Instructions (Signed)
You had an allergic reaction. Suggest use of Benadryl 25mg  every 4 hours through 24 hours. Start in the am as 50mg  given here. The steroid injection will help as well. If you were to worsen in an emergent way, then please go to the ER, this is doubtful at this point. Feel better!

## 2016-09-05 ENCOUNTER — Encounter (HOSPITAL_COMMUNITY): Payer: Self-pay

## 2016-09-05 ENCOUNTER — Ambulatory Visit (HOSPITAL_COMMUNITY)
Admission: EM | Admit: 2016-09-05 | Discharge: 2016-09-05 | Disposition: A | Discharge status: Home / Self Care | Payer: BC Managed Care – PPO | Attending: Family Medicine | Admitting: Family Medicine

## 2016-09-05 DIAGNOSIS — S29012A Strain of muscle and tendon of back wall of thorax, initial encounter: Secondary | ICD-10-CM | POA: Diagnosis not present

## 2016-09-05 MED ORDER — IBUPROFEN 800 MG PO TABS: 800.0000 mg | ORAL_TABLET | Freq: Three times a day (TID) | ORAL | 0 refills | Status: AC

## 2016-09-05 MED ORDER — CYCLOBENZAPRINE HCL 10 MG PO TABS: 10.0000 mg | ORAL_TABLET | Freq: Three times a day (TID) | ORAL | 0 refills | Status: AC | PRN

## 2016-09-05 NOTE — ED Provider Notes (Signed)
CSN: [id number]     Arrival date & time 09/05/16  1908 History   First MD Initiated Contact with Patient 09/05/16 2051     Chief Complaint  Patient presents with  . Back Pain   (Consider location/radiation/quality/duration/timing/severity/associated sxs/prior Treatment) See below. Patient denies strenuous activity, heavy lifting, or injury. Patient states that she first noticed this pain during her shower this morning. Patient has been taking ibuprofen at home and reports that ibuprofen has not been helping. Denies hx of chronic back pain.     Back Pain  Location:  Thoracic spine (Left upper back) Quality: intermittently sharp  Radiates to:  Does not radiate Pain severity now: 7/10. Pain is:  Same all the time Onset quality:  Sudden Duration:  2 days Timing:  Constant Progression:  Unchanged Chronicity:  New Context: not falling, not lifting heavy objects, not MCA, not MVA, not physical stress, not recent illness, not recent injury and not twisting   Relieved by: Ibuprofen has helped. Associated symptoms: numbness and tingling   Associated symptoms: no abdominal pain, no bladder incontinence, no bowel incontinence, no chest pain, no headaches and no leg pain   Associated symptoms comment:  Intermittent Left arm numbness started yesterday   History reviewed. No pertinent past medical history. Past Surgical History:  Procedure Laterality Date  . CESAREAN SECTION     History reviewed. No pertinent family history. Social History  Substance Use Topics  . Smoking status: Never Smoker  . Smokeless tobacco: Never Used  . Alcohol use Yes   OB History    No data available     Review of Systems  Constitutional:       As stated in history of present illness  Cardiovascular: Negative for chest pain.  Gastrointestinal: Negative for abdominal pain and bowel incontinence.  Genitourinary: Negative for bladder incontinence.  Musculoskeletal: Positive for back pain.  Neurological:  Positive for tingling and numbness. Negative for headaches.    Allergies  Hydrocodone  Home Medications   Prior to Admission medications   Medication Sig Start Date End Date Taking? Authorizing Provider  cyclobenzaprine (FLEXERIL) 10 MG tablet Take 1 tablet (10 mg total) by mouth 3 (three) times daily as needed for muscle spasms. 09/05/16 09/10/16  Lucia Estelle, NP  diclofenac (CATAFLAM) 50 MG tablet Take 1 tablet (50 mg total) by mouth 3 (three) times daily. 05/05/13   Linna Hoff, MD  ibuprofen (ADVIL,MOTRIN) 800 MG tablet Take 1 tablet (800 mg total) by mouth 3 (three) times daily. 09/05/16 09/10/16  Lucia Estelle, NP  Lorcaserin HCl (BELVIQ) 10 MG TABS Take by mouth.    Historical Provider, MD  sulfamethoxazole-trimethoprim (SEPTRA DS) 800-160 MG per tablet Take 1 tablet by mouth 2 (two) times daily. X 7 days 11/03/12   Hayden Rasmussen, NP   Meds Ordered and Administered this Visit  Medications - No data to display  BP 122/84 (BP Location: Right Arm)   Pulse 69   Temp 97.8 F (36.6 C) (Oral)   Resp 16   LMP 07/27/2016 (Exact Date)   SpO2 99%  No data found.   Physical Exam  Constitutional: She is oriented to person, place, and time. She appears well-developed and well-nourished.  Cardiovascular: Normal rate, regular rhythm and normal heart sounds.   Pulmonary/Chest: Effort normal and breath sounds normal. She has no wheezes.  Musculoskeletal:  Has no tenderness over the C-spine, T-spine and L-spine. There is soreness and tenderness on palpation over the left middle back.   Neurological: She  is alert and oriented to person, place, and time.  Nursing note and vitals reviewed.   Urgent Care Course     Procedures (including critical care time)  Labs Review Labs Reviewed - No data to display  Imaging Review No results found.  MDM   1. Muscle strain of left upper back, initial encounter    Left upper back pain is acute and localizes at the left upper back.  Pain is believed to  be muscular in etiology. We'll treat with ibuprofen 800 mg 3 times a day as needed for pain and Flexeril 3 times a day as needed. Follow with primary care doctor if pain does not improve.   Lucia EstelleFeng Oluwasemilore Pascuzzi, NP 09/05/16 2110

## 2016-09-05 NOTE — ED Triage Notes (Signed)
Patient presents to [facility] with complaints of upper left back pain since yesterday 09/04/2016, pt states she has been taking 800 mg of ibuprofen with no relief, pt also complains of numbness in left shoulder

## 2017-04-27 ENCOUNTER — Ambulatory Visit (HOSPITAL_COMMUNITY)
Admission: EM | Admit: 2017-04-27 | Discharge: 2017-04-27 | Disposition: A | Discharge status: Home / Self Care | Payer: 59 | Attending: Family Medicine | Admitting: Family Medicine

## 2017-04-27 ENCOUNTER — Encounter (HOSPITAL_COMMUNITY): Payer: Self-pay | Admitting: Emergency Medicine

## 2017-04-27 DIAGNOSIS — J4521 Mild intermittent asthma with (acute) exacerbation: Secondary | ICD-10-CM | POA: Diagnosis not present

## 2017-04-27 MED ORDER — AEROCHAMBER PLUS FLO-VU LARGE MISC
Status: AC
  Filled 2017-04-27: qty 1

## 2017-04-27 MED ORDER — BENZONATATE 100 MG PO CAPS: 100.0000 mg | ORAL_CAPSULE | Freq: Three times a day (TID) | ORAL | 0 refills | Status: AC

## 2017-04-27 MED ORDER — ALBUTEROL SULFATE HFA 108 (90 BASE) MCG/ACT IN AERS: 1.0000 | INHALATION_SPRAY | Freq: Four times a day (QID) | RESPIRATORY_TRACT | 0 refills | Status: AC | PRN

## 2017-04-27 MED ORDER — PREDNISONE 10 MG (21) PO TBPK: ORAL_TABLET | Freq: Every day | ORAL | 0 refills | Status: AC

## 2017-04-27 NOTE — ED Triage Notes (Signed)
Pt reports issues with her asthma that her inhaler has not been helping with for the last week.

## 2017-04-27 NOTE — ED Provider Notes (Signed)
[facility]    CSN: [id number] Arrival date & time: 04/27/17  1535     History   Chief Complaint Chief Complaint  Patient presents with  . Asthma    HPI Michelle Barry is a 51 y.o. female.   51 year old female comes in for a few week history of asthma exacerbation. Patient states that in the past she was on maintenance medication for asthma, but has discontinued for many years due to improvement of symptoms. She has not needed her albuterol inhaler for quite a while, but has noticed for the past few weeks, has needed to use her albuterol inhaler every night. For the past week, she has needed to use a multiple times during the day as well. Denies fever, chills, night sweats. She has been having nonproductive cough, that is causing chest and back pain when coughing. She has also noticed some sore throat from all the coughing. Denies shortness of breath. Denies ear pain, eye pain. Denies abdominal pain, nausea, vomiting, diarrhea. She has not taken anything else for her symptoms. She states she usually follows her GYN for evaluations given she has not had an exacerbation of asthma for many years, she does not have a PCP she follows with.      History reviewed. No pertinent past medical history.  There are no active problems to display for this patient.   Past Surgical History:  Procedure Laterality Date  . CESAREAN SECTION      OB History    No data available       Home Medications    Prior to Admission medications   Medication Sig Start Date End Date Taking? Authorizing Provider  albuterol (PROAIR HFA) 108 (90 Base) MCG/ACT inhaler Inhale 1-2 puffs into the lungs every 6 (six) hours as needed for wheezing. 04/27/17   Cathie HoopsYu, Amy V, PA-C  benzonatate (TESSALON) 100 MG capsule Take 1 capsule (100 mg total) by mouth every 8 (eight) hours. 04/27/17   Cathie HoopsYu, Amy V, PA-C  predniSONE (STERAPRED UNI-PAK 21 TAB) 10 MG (21) TBPK tablet Take by mouth daily. Take 6 tabs by mouth day 1,  then 5 tabs, then 4 tabs, then 3 tabs, 2 tabs, then 1 tab for the last day 04/27/17   Belinda FisherYu, Amy V, PA-C    Family History History reviewed. No pertinent family history.  Social History Social History  Substance Use Topics  . Smoking status: Never Smoker  . Smokeless tobacco: Never Used  . Alcohol use Yes     Allergies   Hydrocodone   Review of Systems Review of Systems  Reason unable to perform ROS: See HPI as above.     Physical Exam Triage Vital Signs ED Triage Vitals [04/27/17 1558]  Enc Vitals Group     BP 112/73     Pulse Rate 85     Resp      Temp 98.3 F (36.8 C)     Temp Source Oral     SpO2 100 %     Weight      Height      Head Circumference      Peak Flow      Pain Score 5     Pain Loc      Pain Edu?      Excl. in GC?    No data found.   Updated Vital Signs BP 112/73 (BP Location: Left Arm)   Pulse 85   Temp 98.3 F (36.8 C) (Oral)   LMP 03/27/2017 (  Approximate)   SpO2 100%   Physical Exam  Constitutional: She is oriented to person, place, and time. She appears well-developed and well-nourished. No distress.  HENT:  Head: Normocephalic and atraumatic.  Right Ear: Tympanic membrane, external ear and ear canal normal. Tympanic membrane is not erythematous and not bulging.  Left Ear: Tympanic membrane, external ear and ear canal normal. Tympanic membrane is not erythematous and not bulging.  Nose: Nose normal. Right sinus exhibits no maxillary sinus tenderness and no frontal sinus tenderness. Left sinus exhibits no maxillary sinus tenderness and no frontal sinus tenderness.  Mouth/Throat: Uvula is midline, oropharynx is clear and moist and mucous membranes are normal.  Eyes: Pupils are equal, round, and reactive to light. Conjunctivae are normal.  Neck: Normal range of motion. Neck supple.  Cardiovascular: Normal rate, regular rhythm and normal heart sounds.  Exam reveals no gallop and no friction rub.   No murmur heard. Pulmonary/Chest: Effort  normal and breath sounds normal. She has no decreased breath sounds. She has no wheezes. She has no rhonchi. She has no rales.  Patient coughing on exam  Lymphadenopathy:    She has no cervical adenopathy.  Neurological: She is alert and oriented to person, place, and time.  Skin: Skin is warm and dry.  Psychiatric: She has a normal mood and affect. Her behavior is normal. Judgment normal.     UC Treatments / Results  Labs (all labs ordered are listed, but only abnormal results are displayed) Labs Reviewed - No data to display  EKG  EKG Interpretation None       Radiology No results found.  Procedures Procedures (including critical care time)  Medications Ordered in UC Medications - No data to display   Initial Impression / Assessment and Plan / UC Course  I have reviewed the triage vital signs and the nursing notes.  Pertinent labs & imaging results that were available during my care of the patient were reviewed by me and considered in my medical decision making (see chart for details).     Lung exam clear to auscultation bilaterally without adventitious lung sounds, O2 saturation 100% at room air. Refill albuterol, and spacer provided for better intake of medication. Prednisone as directed. Tessalon for cough. Patient to follow up with PCP for further evaluation and management needed for asthma. Return precautions given.   Final Clinical Impressions(s) / UC Diagnoses   Final diagnoses:  Mild intermittent asthma with exacerbation    New Prescriptions Discharge Medication List as of 04/27/2017  4:44 PM    START taking these medications   Details  benzonatate (TESSALON) 100 MG capsule Take 1 capsule (100 mg total) by mouth every 8 (eight) hours., Starting Tue 04/27/2017, Normal    predniSONE (STERAPRED UNI-PAK 21 TAB) 10 MG (21) TBPK tablet Take by mouth daily. Take 6 tabs by mouth day 1, then 5 tabs, then 4 tabs, then 3 tabs, 2 tabs, then 1 tab for the last day,  Starting Tue 04/27/2017, Normal          Belinda Fisher, PA-C 04/27/17 1653

## 2017-04-27 NOTE — Discharge Instructions (Signed)
Take prednisone as directed. Tessalon for cough. I have refilled your albuterol inhaler, attach spacer for better intake of medicine. Keep hydrated, your urine should be clear to pale yellow in color. Establish care with PCP for further evaluation and management of asthma. Monitor for any worsening of symptoms, chest pain, shortness of breath, wheezing, swelling of the throat, follow up for reevaluation.

## 2018-04-07 DIAGNOSIS — M79644 Pain in right finger(s): Secondary | ICD-10-CM | POA: Diagnosis not present

## 2018-04-07 DIAGNOSIS — T1490XA Injury, unspecified, initial encounter: Secondary | ICD-10-CM | POA: Diagnosis not present

## 2018-04-07 DIAGNOSIS — S62616A Displaced fracture of proximal phalanx of right little finger, initial encounter for closed fracture: Secondary | ICD-10-CM | POA: Diagnosis not present

## 2018-05-03 DIAGNOSIS — S62616A Displaced fracture of proximal phalanx of right little finger, initial encounter for closed fracture: Secondary | ICD-10-CM | POA: Diagnosis not present

## 2018-09-02 DIAGNOSIS — D259 Leiomyoma of uterus, unspecified: Secondary | ICD-10-CM | POA: Diagnosis not present

## 2018-09-02 DIAGNOSIS — Z01419 Encounter for gynecological examination (general) (routine) without abnormal findings: Secondary | ICD-10-CM | POA: Diagnosis not present

## 2018-09-02 DIAGNOSIS — Z0000 Encounter for general adult medical examination without abnormal findings: Secondary | ICD-10-CM | POA: Diagnosis not present

## 2018-09-02 DIAGNOSIS — Z1151 Encounter for screening for human papillomavirus (HPV): Secondary | ICD-10-CM | POA: Diagnosis not present

## 2018-09-02 DIAGNOSIS — Z6839 Body mass index (BMI) 39.0-39.9, adult: Secondary | ICD-10-CM | POA: Diagnosis not present

## 2018-09-02 DIAGNOSIS — Z130 Encounter for screening for diseases of the blood and blood-forming organs and certain disorders involving the immune mechanism: Secondary | ICD-10-CM | POA: Diagnosis not present

## 2018-09-02 DIAGNOSIS — Z1231 Encounter for screening mammogram for malignant neoplasm of breast: Secondary | ICD-10-CM | POA: Diagnosis not present

## 2018-09-02 DIAGNOSIS — Z113 Encounter for screening for infections with a predominantly sexual mode of transmission: Secondary | ICD-10-CM | POA: Diagnosis not present

## 2018-09-02 DIAGNOSIS — Z13220 Encounter for screening for lipoid disorders: Secondary | ICD-10-CM | POA: Diagnosis not present

## 2018-09-02 DIAGNOSIS — E669 Obesity, unspecified: Secondary | ICD-10-CM | POA: Diagnosis not present

## 2018-09-02 DIAGNOSIS — Z131 Encounter for screening for diabetes mellitus: Secondary | ICD-10-CM | POA: Diagnosis not present

## 2018-09-02 DIAGNOSIS — Z1329 Encounter for screening for other suspected endocrine disorder: Secondary | ICD-10-CM | POA: Diagnosis not present

## 2018-09-08 DIAGNOSIS — D500 Iron deficiency anemia secondary to blood loss (chronic): Secondary | ICD-10-CM | POA: Diagnosis not present

## 2018-09-08 DIAGNOSIS — Z1211 Encounter for screening for malignant neoplasm of colon: Secondary | ICD-10-CM | POA: Diagnosis not present

## 2018-10-26 DIAGNOSIS — Z6838 Body mass index (BMI) 38.0-38.9, adult: Secondary | ICD-10-CM | POA: Diagnosis not present

## 2018-10-26 DIAGNOSIS — E669 Obesity, unspecified: Secondary | ICD-10-CM | POA: Diagnosis not present

## 2019-07-28 ENCOUNTER — Other Ambulatory Visit: Payer: Self-pay

## 2019-07-28 DIAGNOSIS — Z20822 Contact with and (suspected) exposure to covid-19: Secondary | ICD-10-CM

## 2019-07-30 LAB — NOVEL CORONAVIRUS, NAA: SARS-CoV-2, NAA: NOT DETECTED

## 2019-09-22 ENCOUNTER — Ambulatory Visit: Payer: Self-pay | Attending: Internal Medicine

## 2019-09-22 DIAGNOSIS — Z20822 Contact with and (suspected) exposure to covid-19: Secondary | ICD-10-CM

## 2019-09-23 LAB — NOVEL CORONAVIRUS, NAA: SARS-CoV-2, NAA: NOT DETECTED
